# Patient Record
Sex: Male | Born: 1995 | Race: White | Hispanic: No | Marital: Single | Smoking: Never smoker
Health system: Southern US, Community
[De-identification: ages and names within clinical notes are randomized; demographics above are authoritative.]

---

## 2019-10-21 ENCOUNTER — Emergency Department: Payer: BC Managed Care – PPO

## 2019-10-21 DIAGNOSIS — R197 Diarrhea, unspecified: Secondary | ICD-10-CM | POA: Diagnosis not present

## 2019-10-21 DIAGNOSIS — Z20828 Contact with and (suspected) exposure to other viral communicable diseases: Secondary | ICD-10-CM | POA: Insufficient documentation

## 2019-10-21 DIAGNOSIS — R55 Syncope and collapse: Secondary | ICD-10-CM | POA: Diagnosis present

## 2019-10-21 DIAGNOSIS — R112 Nausea with vomiting, unspecified: Secondary | ICD-10-CM | POA: Diagnosis not present

## 2019-10-21 DIAGNOSIS — F1290 Cannabis use, unspecified, uncomplicated: Secondary | ICD-10-CM | POA: Insufficient documentation

## 2019-10-21 MED ORDER — SODIUM CHLORIDE 0.9 % IV BOLUS: 1000.0000 mL | Freq: Once | INTRAVENOUS | Status: DC

## 2019-10-21 NOTE — ED Provider Notes (Addendum)
-----------------------------------------   11:48 PM on 10/21/2019 -----------------------------------------  Discussed case with PA Roderic Palau.  In short, this is a young, otherwise healthy male who had a syncopal episode while watching a splint placement as a visitor in the emergency department.  He had been having several days of nausea, vomiting and diarrhea.  Denies this to me.  Lab work, UA, CT head pending. Patient sent to the lobby by charge nurse to await treatment room.   Paulette Blanch, MD 10/22/19 [id number]    Paulette Blanch, MD 10/22/19 0239   ----------------------------------------- 5:29 AM on 10/22/2019 -----------------------------------------  Patient was brought back to her treatment room.  His physical exam was within normal limits.  He is feeling significantly better with some IV fluids.  Updated patient on all test results.  ED ECG REPORT I, Shilynn Hoch J, the attending physician, personally viewed and interpreted this ECG.   Date: 10/22/2019  EKG Time: 0031  Rate: 91  Rhythm: normal EKG, normal sinus rhythm  Axis: Normal  Intervals:none  ST&T Change: Nonspecific   CT Head interpreted per Dr. Golden Circle: Normal head CT  CXR interpreted per Dr. Marguerita Merles: No acute cardiopulmonary abnormality.  Strict return precautions given. Patient verbalizes understanding and agrees with plan of care.    Paulette Blanch, MD 10/22/19 [phone]

## 2019-10-21 NOTE — ED Notes (Signed)
This pt was visiting girlfriend; this tech and [place], EDT in to put splint on pts girlfriend at the time. Pt started c/o dizziness and feeling like "passing out". [place] PA and [place] PA came into rm to assist. Pts CBG was checked and was at 102. Pt placed on bed and put in trendelenburg position.

## 2019-10-21 NOTE — ED Provider Notes (Signed)
[facility] Emergency Department Provider Note  ____________________________________________  Time seen: Approximately 11:37 PM  I have reviewed the triage vital signs and the nursing notes.   HISTORY  Chief Complaint No chief complaint on file.    HPI Eric Burton is a 23 y.o. male who presents the emergency department with his girlfriend.  Patient was a visitor with his girlfriend in the emergency department.  His girlfriend had been injured in a motor vehicle collision.  While visiting, patient began to feel faint and queasy, and had a syncopal episode.  Patient states that he had a history of syncope "a long time ago" but has had no recent syncopal episodes.  Patient states that he has had some GI upset over the last 3 to 4 days with some nausea, vomiting and diarrhea.  No fevers or chills.  No URI symptoms.  Patient states that he has not had much to eat or drink today.  Patient currently denies any headache, neck pain or stiffness, fevers or chills, chest pain, shortness of breath abdominal pain.  No nausea currently.  Patient was assessed by myself immediately during syncopal episode.  Initially patient was hard to arouse.  Patient slowly regained consciousness and awareness and was able to answer questions appropriately at this time.  Patient denies any chronic medical problems.  No chronic medications.         No past medical history on file.  There are no active problems to display for this patient.     Prior to Admission medications   Not on File    Allergies Patient has no allergy information on record.  No family history on file.  Social History Social History   Tobacco Use  . Smoking status: Not on file  Substance Use Topics  . Alcohol use: Not on file  . Drug use: Not on file     Review of Systems  Constitutional: No fever/chills Eyes: No visual changes. No discharge ENT: No upper respiratory complaints. Cardiovascular: no  chest pain. Respiratory: no cough. No SOB. Gastrointestinal: No abdominal pain.  3 to 4 days of nausea, vomiting, diarrhea..  No constipation. Genitourinary: Negative for dysuria. No hematuria Musculoskeletal: Negative for musculoskeletal pain. Skin: Negative for rash, abrasions, lacerations, ecchymosis. Neurological: Negative for headaches, focal weakness or numbness. 10-point ROS otherwise negative.  ____________________________________________   PHYSICAL EXAM:  VITAL SIGNS: ED Triage Vitals [10/21/19 2334]  Enc Vitals Group     BP (!) 126/52     Pulse Rate (!) 113     Resp 20     Temp 98.6 F (37 C)     Temp Source Oral     SpO2 97 %     Weight      Height      Head Circumference      Peak Flow      Pain Score      Pain Loc      Pain Edu?      Excl. in GC?      Constitutional: On initial assessment, patient was unconscious.  Patient regained consciousness and is alert and oriented at this time.. Well appearing and in no acute distress. Eyes: Conjunctivae are normal. PERRL. EOMI. Head: Atraumatic. ENT:      Ears:       Nose: No congestion/rhinnorhea.      Mouth/Throat: Mucous membranes are moist.  Neck: No stridor.  Neck is supple full range of motion Hematological/Lymphatic/Immunilogical: No cervical lymphadenopathy. Cardiovascular: Normal rate, regular  rhythm. Normal S1 and S2.  No murmurs, rubs, gallops.  No apical heave.  Good peripheral circulation. Respiratory: Normal respiratory effort without tachypnea or retractions. Lungs CTAB. Good air entry to the bases with no decreased or absent breath sounds. Gastrointestinal: Bowel sounds 4 quadrants. Soft and nontender to palpation. No guarding or rigidity. No palpable masses. No distention. No CVA tenderness. Musculoskeletal: Full range of motion to all extremities. No gross deformities appreciated. Neurologic:  Normal speech and language. No gross focal neurologic deficits are appreciated.  Skin:  Skin is warm,  dry and intact. No rash noted. Psychiatric: Mood and affect are normal. Speech and behavior are normal. Patient exhibits appropriate insight and judgement.   ____________________________________________   LABS (all labs ordered are listed, but only abnormal results are displayed)  Labs Reviewed  SARS CORONAVIRUS 2 (TAT 6-24 HRS)  CBC WITH DIFFERENTIAL/PLATELET  COMPREHENSIVE METABOLIC PANEL  URINALYSIS, ROUTINE W REFLEX MICROSCOPIC  URINE DRUG SCREEN, QUALITATIVE ([facility] ONLY)  LIPASE, BLOOD  TROPONIN I (HIGH SENSITIVITY)   ____________________________________________  EKG   ____________________________________________  RADIOLOGY   No results found.  ____________________________________________    PROCEDURES  Procedure(s) performed:    Procedures    Medications  sodium chloride 0.9 % bolus 1,000 mL (has no administration in time range)     ____________________________________________   INITIAL IMPRESSION / ASSESSMENT AND PLAN / ED COURSE  Pertinent labs & imaging results that were available during my care of the patient were reviewed by me and considered in my medical decision making (see chart for details).  Review of the [place] CSRS was performed in accordance of the [place] prior to dispensing any controlled drugs.          Patient presented to the emergency department visit another individual who is currently receiving care.  Patient had a syncopal episode in the room.  Patient is currently alert and oriented.  At this time, orders have been placed for evaluation.  This section emergency department is closing and the patient will be transferred to another section to await completion of work-up.  Patient care will be transferred to attending provider, Dr. Beather Arbour at this time.  Dr. Beather Arbour is made aware of the patient's presentation, physical exam and orders at this point.  Final diagnosis and disposition will be provided by Dr. Beather Arbour.     This chart was dictated  using voice recognition software/Dragon. Despite best efforts to proofread, errors can occur which can change the meaning. Any change was purely unintentional.    Darletta Moll, PA-C 10/21/19 2350    Paulette Blanch, MD 10/23/19 [phone]

## 2019-10-22 ENCOUNTER — Encounter: Payer: Self-pay | Admitting: *Deleted

## 2019-10-22 ENCOUNTER — Other Ambulatory Visit: Payer: Self-pay

## 2019-10-22 ENCOUNTER — Emergency Department
Admission: EM | Admit: 2019-10-22 | Discharge: 2019-10-22 | Disposition: A | Discharge status: Home / Self Care | Payer: BC Managed Care – PPO | Attending: Emergency Medicine | Admitting: Emergency Medicine

## 2019-10-22 DIAGNOSIS — R55 Syncope and collapse: Secondary | ICD-10-CM

## 2019-10-22 DIAGNOSIS — F1290 Cannabis use, unspecified, uncomplicated: Secondary | ICD-10-CM

## 2019-10-22 LAB — COMPREHENSIVE METABOLIC PANEL
ALT: 72 U/L — ABNORMAL HIGH (ref 0–44)
AST: 44 U/L — ABNORMAL HIGH (ref 15–41)
Albumin: 4.4 g/dL (ref 3.5–5.0)
Alkaline Phosphatase: 53 U/L (ref 38–126)
Anion gap: 14 (ref 5–15)
BUN: 16 mg/dL (ref 6–20)
CO2: 23 mmol/L (ref 22–32)
Calcium: 9.3 mg/dL (ref 8.9–10.3)
Chloride: 102 mmol/L (ref 98–111)
Creatinine, Ser: 0.81 mg/dL (ref 0.61–1.24)
GFR calc Af Amer: >60 mL/min (ref >60)
GFR calc non Af Amer: >60 mL/min (ref >60)
Glucose, Bld: 133 mg/dL — ABNORMAL HIGH (ref 70–99)
Potassium: 3.9 mmol/L (ref 3.5–5.1)
Sodium: 139 mmol/L (ref 135–145)
Total Bilirubin: 1.2 mg/dL (ref 0.3–1.2)
Total Protein: 7.7 g/dL (ref 6.5–8.1)

## 2019-10-22 LAB — CBC WITH DIFFERENTIAL/PLATELET
Abs Immature Granulocytes: 0.05 10*3/uL (ref 0.00–0.07)
Basophils Absolute: 0.1 10*3/uL (ref 0.0–0.1)
Basophils Relative: 1 %
Eosinophils Absolute: 0.1 10*3/uL (ref 0.0–0.5)
Eosinophils Relative: 1 %
HCT: 45.7 % (ref 39.0–52.0)
Hemoglobin: 15 g/dL (ref 13.0–17.0)
Immature Granulocytes: 1 %
Lymphocytes Relative: 30 %
Lymphs Abs: 3.2 10*3/uL (ref 0.7–4.0)
MCH: 27.6 pg (ref 26.0–34.0)
MCHC: 32.8 g/dL (ref 30.0–36.0)
MCV: 84 fL (ref 80.0–100.0)
Monocytes Absolute: 0.7 10*3/uL (ref 0.1–1.0)
Monocytes Relative: 6 %
Neutro Abs: 6.7 10*3/uL (ref 1.7–7.7)
Neutrophils Relative %: 61 %
Platelets: 257 10*3/uL (ref 150–400)
RBC: 5.44 MIL/uL (ref 4.22–5.81)
RDW: 12.9 % (ref 11.5–15.5)
WBC: 10.7 10*3/uL — ABNORMAL HIGH (ref 4.0–10.5)
nRBC: 0 % (ref 0.0–0.2)

## 2019-10-22 LAB — URINALYSIS, ROUTINE W REFLEX MICROSCOPIC
Bacteria, UA: NONE SEEN
Bilirubin Urine: NEGATIVE
Glucose, UA: NEGATIVE mg/dL
Hgb urine dipstick: NEGATIVE
Ketones, ur: NEGATIVE mg/dL
Leukocytes,Ua: NEGATIVE
Nitrite: NEGATIVE
Protein, ur: 100 mg/dL — AB
Specific Gravity, Urine: 1.025 (ref 1.005–1.030)
Squamous Epithelial / HPF: NONE SEEN (ref 0–5)
pH: 6 (ref 5.0–8.0)

## 2019-10-22 LAB — URINE DRUG SCREEN, QUALITATIVE (ARMC ONLY)
Amphetamines, Ur Screen: NOT DETECTED
Barbiturates, Ur Screen: NOT DETECTED
Benzodiazepine, Ur Scrn: NOT DETECTED
Cannabinoid 50 Ng, Ur ~~LOC~~: POSITIVE — AB
Cocaine Metabolite,Ur ~~LOC~~: NOT DETECTED
MDMA (Ecstasy)Ur Screen: NOT DETECTED
Methadone Scn, Ur: NOT DETECTED
Opiate, Ur Screen: NOT DETECTED
Phencyclidine (PCP) Ur S: NOT DETECTED
Tricyclic, Ur Screen: NOT DETECTED

## 2019-10-22 LAB — GLUCOSE, CAPILLARY: Glucose-Capillary: 102 mg/dL — ABNORMAL HIGH (ref 70–99)

## 2019-10-22 LAB — LIPASE, BLOOD: Lipase: 35 U/L (ref 11–51)

## 2019-10-22 LAB — SARS CORONAVIRUS 2 (TAT 6-24 HRS): SARS Coronavirus 2: NEGATIVE

## 2019-10-22 LAB — TROPONIN I (HIGH SENSITIVITY)
Troponin I (High Sensitivity): 3 ng/L (ref <18)
Troponin I (High Sensitivity): <2 ng/L (ref <18)

## 2019-10-22 MED ORDER — SODIUM CHLORIDE 0.9 % IV BOLUS
1000.0000 mL | Freq: Once | INTRAVENOUS | Status: AC
  Administered 2019-10-22: 1000 mL via INTRAVENOUS

## 2019-10-22 MED ORDER — ONDANSETRON 4 MG PO TBDP: 4.0000 mg | ORAL_TABLET | Freq: Once | ORAL | Status: DC

## 2019-10-22 NOTE — Discharge Instructions (Signed)
Drink plenty of fluids daily.  Return to the ER for recurrent or worsening symptoms, persistent vomiting, difficulty breathing or other concerns. °

## 2019-10-22 NOTE — ED Notes (Signed)
Brought pt back to draw repeat troponin, during blood draw, pt reports he felt like he was going to pass out. Pt became diaphoretic and pale, pressure 77/42, laid pt down in chair, reports he is feeling better, BP 116/52

## 2019-10-22 NOTE — ED Notes (Signed)
Pt resting on stretcher with visitor at the bedside. Pt to complete fluid bolus prior to discharge. Iv fluids infusing without difficulty.

## 2020-04-08 ENCOUNTER — Encounter: Payer: Self-pay | Admitting: Emergency Medicine

## 2020-04-08 ENCOUNTER — Emergency Department
Admission: EM | Admit: 2020-04-08 | Discharge: 2020-04-08 | Disposition: A | Discharge status: Home / Self Care | Attending: Emergency Medicine | Admitting: Emergency Medicine

## 2020-04-08 ENCOUNTER — Emergency Department

## 2020-04-08 ENCOUNTER — Other Ambulatory Visit: Payer: Self-pay

## 2020-04-08 DIAGNOSIS — Y929 Unspecified place or not applicable: Secondary | ICD-10-CM | POA: Diagnosis not present

## 2020-04-08 DIAGNOSIS — S61221A Laceration with foreign body of left index finger without damage to nail, initial encounter: Secondary | ICD-10-CM | POA: Insufficient documentation

## 2020-04-08 DIAGNOSIS — Y990 Civilian activity done for income or pay: Secondary | ICD-10-CM | POA: Insufficient documentation

## 2020-04-08 DIAGNOSIS — S6992XA Unspecified injury of left wrist, hand and finger(s), initial encounter: Secondary | ICD-10-CM | POA: Diagnosis present

## 2020-04-08 DIAGNOSIS — Y939 Activity, unspecified: Secondary | ICD-10-CM | POA: Diagnosis not present

## 2020-04-08 DIAGNOSIS — W269XXA Contact with unspecified sharp object(s), initial encounter: Secondary | ICD-10-CM | POA: Diagnosis not present

## 2020-04-08 DIAGNOSIS — Z23 Encounter for immunization: Secondary | ICD-10-CM | POA: Diagnosis not present

## 2020-04-08 MED ORDER — CEPHALEXIN 500 MG PO CAPS: 500.0000 mg | ORAL_CAPSULE | Freq: Three times a day (TID) | ORAL | 0 refills | Status: AC

## 2020-04-08 MED ORDER — TETANUS-DIPHTH-ACELL PERTUSSIS 5-2.5-18.5 LF-MCG/0.5 IM SUSP
0.5000 mL | Freq: Once | INTRAMUSCULAR | Status: AC
  Administered 2020-04-08: 0.5 mL via INTRAMUSCULAR
  Filled 2020-04-08: qty 0.5

## 2020-04-08 MED ORDER — LIDOCAINE HCL (PF) 1 % IJ SOLN
5.0000 mL | Freq: Once | INTRAMUSCULAR | Status: AC
  Administered 2020-04-08: 5 mL via INTRADERMAL
  Filled 2020-04-08: qty 5

## 2020-04-08 MED ORDER — CEPHALEXIN 500 MG PO CAPS
500.0000 mg | ORAL_CAPSULE | Freq: Once | ORAL | Status: AC
  Administered 2020-04-08: 500 mg via ORAL
  Filled 2020-04-08: qty 1

## 2020-04-08 MED ORDER — HYDROCODONE-ACETAMINOPHEN 5-325 MG PO TABS: 1.0000 | ORAL_TABLET | Freq: Four times a day (QID) | ORAL | 0 refills | Status: AC | PRN

## 2020-04-08 NOTE — Discharge Instructions (Addendum)
Please begin antibiotics in the morning. Change dressing tomorrow. Please return to the ER in 2 days for wound recheck. Please call ortho on Monday for follow up appointment next week.

## 2020-04-08 NOTE — ED Notes (Signed)
Patient is soaking left 2nd finger and tolerating it well.

## 2020-04-08 NOTE — ED Triage Notes (Signed)
Pt reprot was at work and working with a grinding machine and hurt left index finger, pt reports went to urgent care and was sent to ER to further evaluate it. Pt is calm, no distress noted, no bleeding at present.

## 2020-04-08 NOTE — ED Provider Notes (Signed)
[facility] Emergency Department Provider Note  ____________________________________________  Time seen: Approximately 9:51 PM  I have reviewed the triage vital signs and the nursing notes.   HISTORY  Chief Complaint Finger Injury    HPI Eric Burton is a 24 y.o. male that presents to the  emergency department for evaluation of left finger laceration.  Patient cut his finger on a grinder at work.  He was referred to the emergency department from [facility].  He is able to bend his finger but it is painful.  He is unsure of last tetanus.  History reviewed. No pertinent past medical history.  There are no problems to display for this patient.   History reviewed. No pertinent surgical history.  Prior to Admission medications   Not on File    Allergies Patient has no known allergies.  No family history on file.  Social History Social History   Tobacco Use  . Smoking status: Never Smoker  Substance Use Topics  . Alcohol use: Not Currently  . Drug use: Not Currently     Review of Systems  Gastrointestinal: No nausea, no vomiting.  Musculoskeletal: Positive for finger pain. Skin: Negative for rash, ecchymosis. Positive for laceration. Neurological: Negative for numbness or tingling   ____________________________________________   PHYSICAL EXAM:  VITAL SIGNS: ED Triage Vitals [04/08/20 2108]  Enc Vitals Group     BP 132/75     Pulse Rate 65     Resp 20     Temp 97.8 F (36.6 C)     Temp Source Oral     SpO2 99 %     Weight 275 lb (124.7 kg)     Height 6' (1.829 m)     Head Circumference      Peak Flow      Pain Score 3     Pain Loc      Pain Edu?      Excl. in GC?      Constitutional: Alert and oriented. Well appearing and in no acute distress. Eyes: Conjunctivae are normal. PERRL. EOMI. Head: Atraumatic. ENT:      Ears:      Nose: No congestion/rhinnorhea.      Mouth/Throat: Mucous membranes are moist.  Neck: No  stridor. Cardiovascular: Normal rate, regular rhythm.  Good peripheral circulation. Respiratory: Normal respiratory effort without tachypnea or retractions. Lungs CTAB. Good air entry to the bases with no decreased or absent breath sounds. Musculoskeletal: Full range of motion to all extremities. No gross deformities appreciated.  Able to flex and extend finger. Neurologic:  Normal speech and language. No gross focal neurologic deficits are appreciated.  Skin:  Skin is warm, dry.  2 cm dirty  laceration to dorsal left index finger over PIP joint. Psychiatric: Mood and affect are normal. Speech and behavior are normal. Patient exhibits appropriate insight and judgement.   ____________________________________________   LABS (all labs ordered are listed, but only abnormal results are displayed)  Labs Reviewed - No data to display ____________________________________________  EKG   ____________________________________________  RADIOLOGY Lexine Baton, personally viewed and evaluated these images (plain radiographs) as part of my medical decision making, as well as reviewing the written report by the radiologist.  DG Finger Index Left  Result Date: 04/08/2020 CLINICAL DATA:  Second digit laceration while using grinding machine, initial encounter EXAM: LEFT INDEX FINGER 2+V COMPARISON:  None. FINDINGS: Soft tissue swelling is noted consistent with the given clinical history. No acute bony abnormality is seen. No  radiopaque foreign body is noted. IMPRESSION: Soft tissue swelling without acute bony abnormality. Electronically Signed   By: Inez Catalina M.D.   On: 04/08/2020 21:28    ____________________________________________    PROCEDURES  Procedure(s) performed:    Procedures  LACERATION REPAIR Performed by: Laban Emperor  Consent: Verbal consent obtained.  Consent given by: patient  Prepped and Draped in normal sterile fashion  Wound explored: No foreign bodies    Laceration Location: finger  Laceration Length: 1 cm  Anesthesia: None  Local anesthetic: lidocaine 1% without epinephrine  Anesthetic total: 4 ml  Irrigation method: syringe  Amount of cleaning: 543ml normal saline  Skin closure: Steri strips  Patient tolerance: Patient tolerated the procedure well with no immediate complications.  Medications  lidocaine (PF) (XYLOCAINE) 1 % injection 5 mL (has no administration in time range)  Tdap (BOOSTRIX) injection 0.5 mL (has no administration in time range)  cephALEXin (KEFLEX) capsule 500 mg (has no administration in time range)     ____________________________________________   INITIAL IMPRESSION / ASSESSMENT AND PLAN / ED COURSE  Pertinent labs & imaging results that were available during my care of the patient were reviewed by me and considered in my medical decision making (see chart for details).  Review of the [place] CSRS was performed in accordance of the [place] prior to dispensing any controlled drugs.   Patient presented to the emergency department for evaluation of finger injury.  Vital signs and exam are reassuring.  X-ray negative for acute bony abnormalities.  Patient has a dirty laceration to the dorsal left index finger.  Patient cut his finger on a grinder and has blackened skin with metal shavings and debris inside laceration.  Laceration was thoroughly cleaned by myself and [facility].  I attempted to remove as much shavings and debris as possible.  Case was discussed with Dr. Darl Householder, who is in agreement that laceration would best be repaired with Steri-Strips and would have a higher risk of infection with sutures.  Patient will have close follow-up.  Laceration was repaired with Steri-Strips.  Splint was placed.  Patient will be discharged home with prescriptions for Keflex and a short course of Vicodin. Patient is to follow up with emergency department and orthopedics.  As directed. Patient is given ED precautions to return to  the ED for any worsening or new symptoms.  Eric Burton was evaluated in Emergency Department on 04/08/2020 for the symptoms described in the history of present illness. He was evaluated in the context of the global COVID-19 pandemic, which necessitated consideration that the patient might be at risk for infection with the SARS-CoV-2 virus that causes COVID-19. Institutional protocols and algorithms that pertain to the evaluation of patients at risk for COVID-19 are in a state of rapid change based on information released by regulatory bodies including the CDC and federal and state organizations. These policies and algorithms were followed during the patient's care in the ED   ____________________________________________  FINAL CLINICAL IMPRESSION(S) / ED DIAGNOSES  Final diagnoses:  None      NEW MEDICATIONS STARTED DURING THIS VISIT:  ED Discharge Orders    None          This chart was dictated using voice recognition software/Dragon. Despite best efforts to proofread, errors can occur which can change the meaning. Any change was purely unintentional.    Laban Emperor, PA-C 04/11/20 1810    Drenda Freeze, MD 04/12/20 [phone]

## 2020-11-29 ENCOUNTER — Other Ambulatory Visit: Payer: Self-pay | Admitting: Physician Assistant

## 2020-11-29 DIAGNOSIS — R7989 Other specified abnormal findings of blood chemistry: Secondary | ICD-10-CM

## 2020-12-21 ENCOUNTER — Ambulatory Visit
Admission: RE | Admit: 2020-12-21 | Discharge: 2020-12-21 | Disposition: A | Discharge status: Home / Self Care | Payer: BC Managed Care – PPO | Source: Ambulatory Visit | Attending: Physician Assistant | Admitting: Physician Assistant

## 2020-12-21 DIAGNOSIS — R7989 Other specified abnormal findings of blood chemistry: Secondary | ICD-10-CM

## 2022-04-22 IMAGING — US US ABDOMEN COMPLETE
1 series · 14 of 25 positions shown · non-contrast
Comparison: None.

CLINICAL DATA: Elevated LFTs

EXAM:
ABDOMEN ULTRASOUND COMPLETE

[Series 1: us abdomen complete · 0.34mm/px · 14 of 90 slices shown]
[im 1/90]
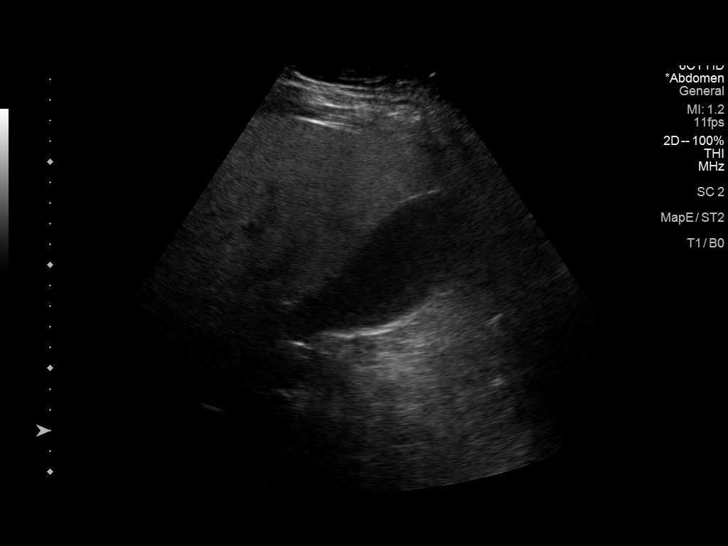
[im 8/90]
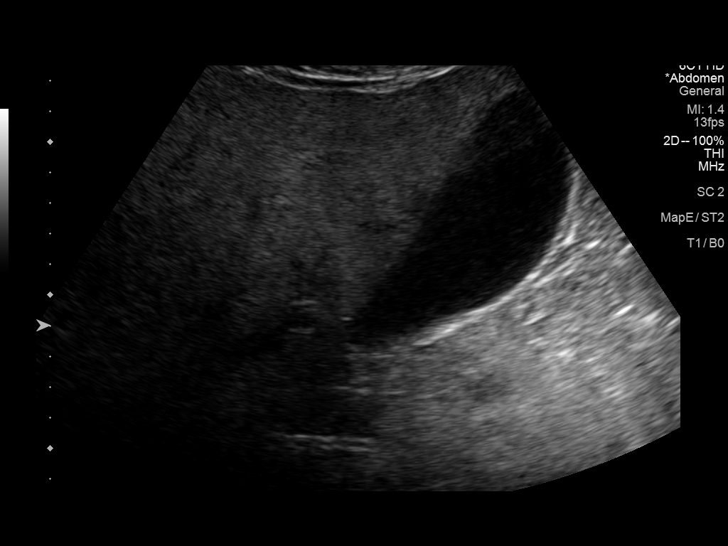
[im 15/90]
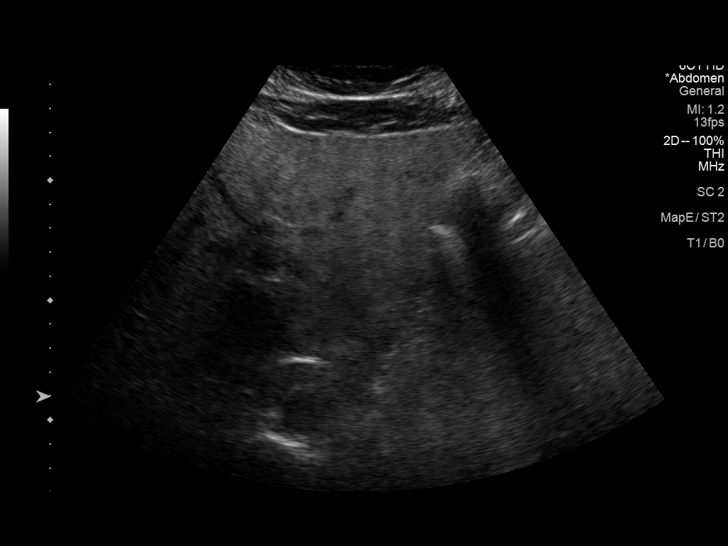
[im 23/90]
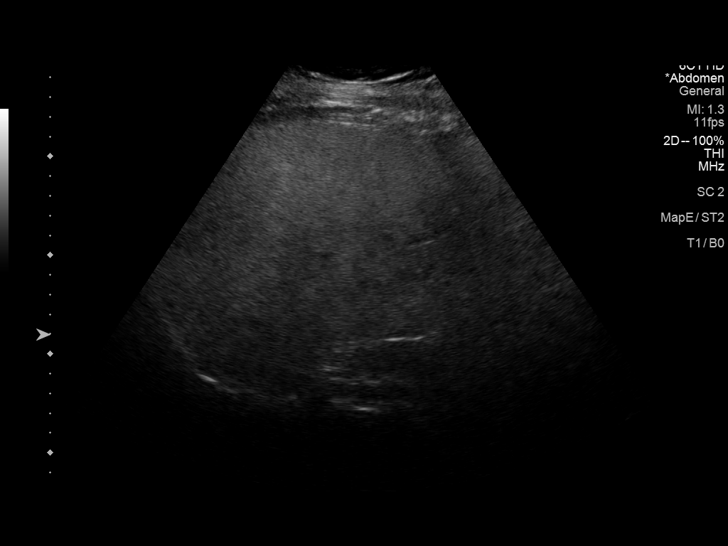
[im 30/90]
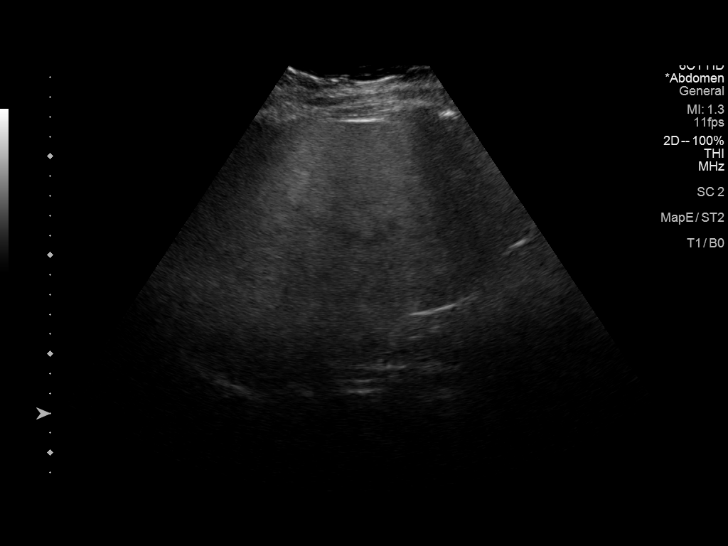
[im 34/90]
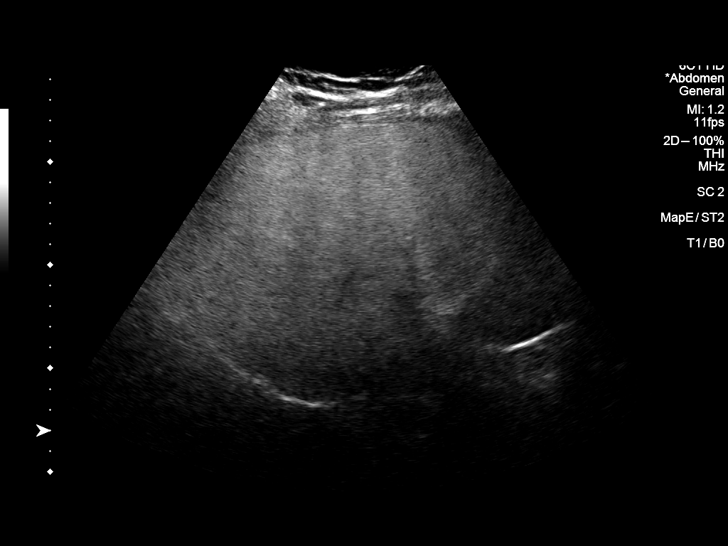
[im 41/90]
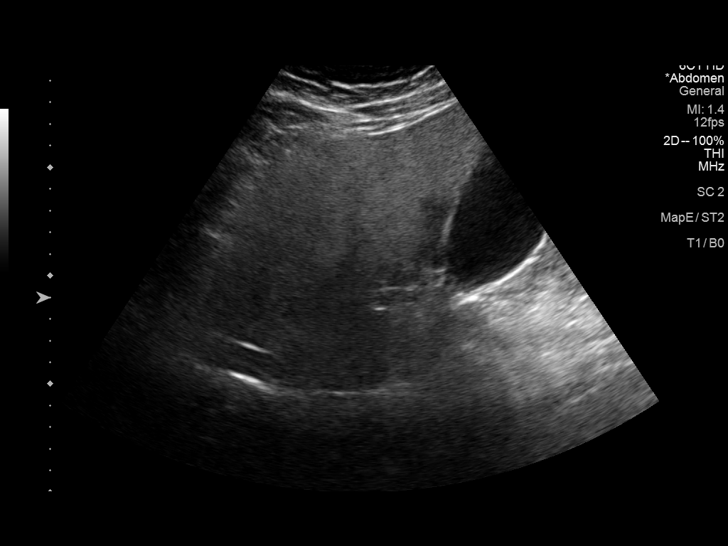
[im 49/90]
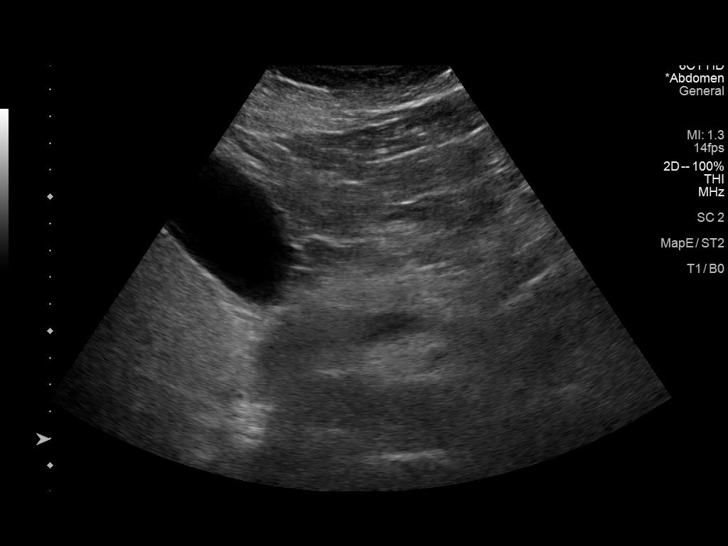
[im 56/90]
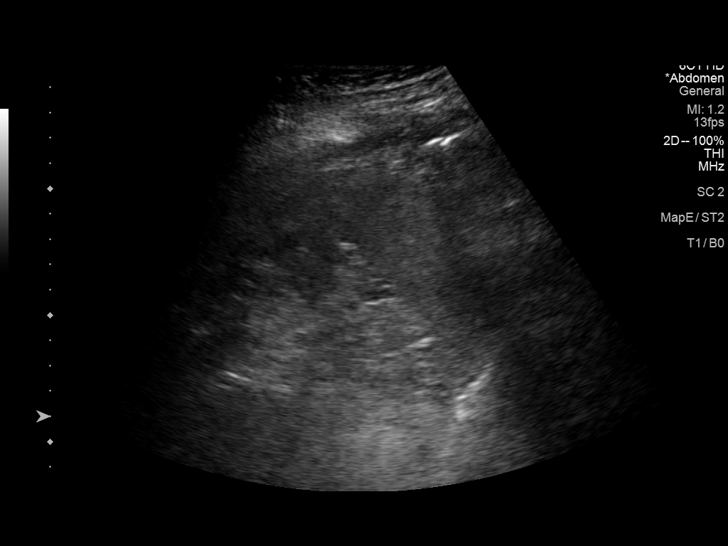
[im 60/90]
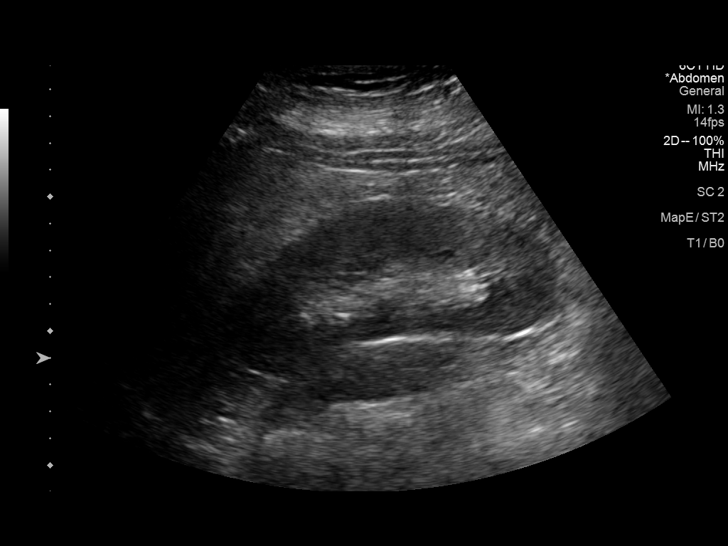
[im 67/90]
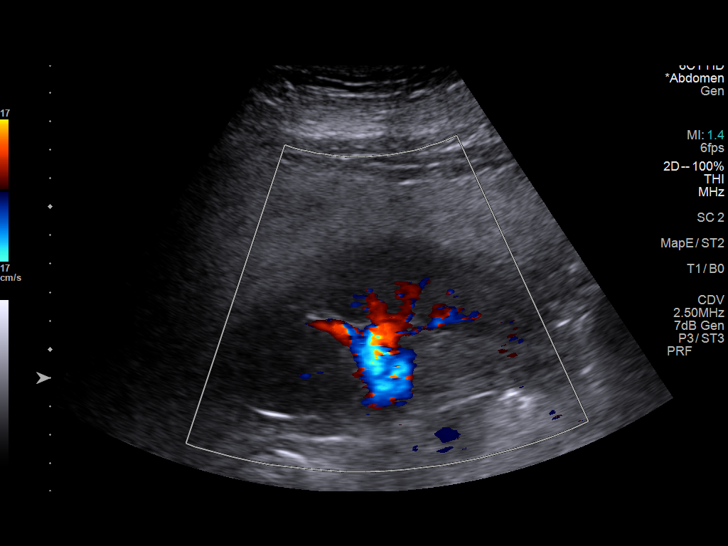
[im 75/90]
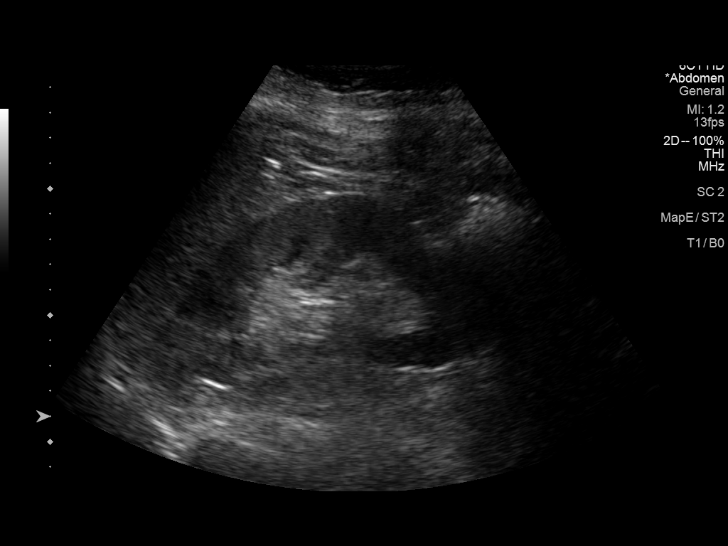
[im 82/90]
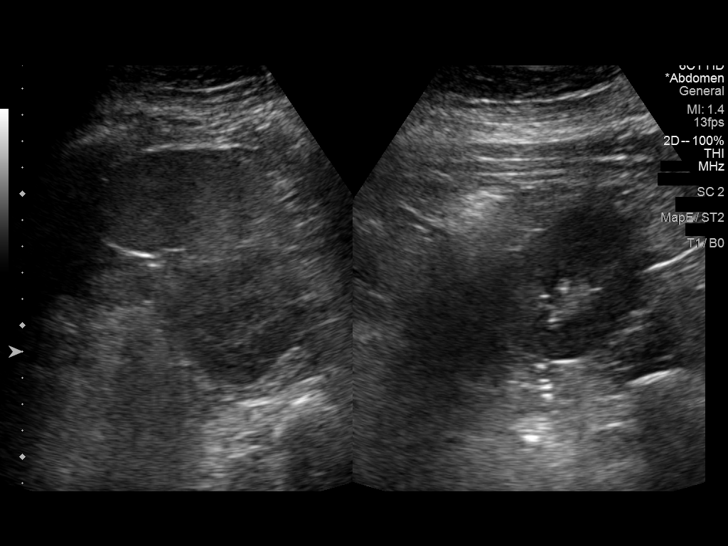
[im 90/90]
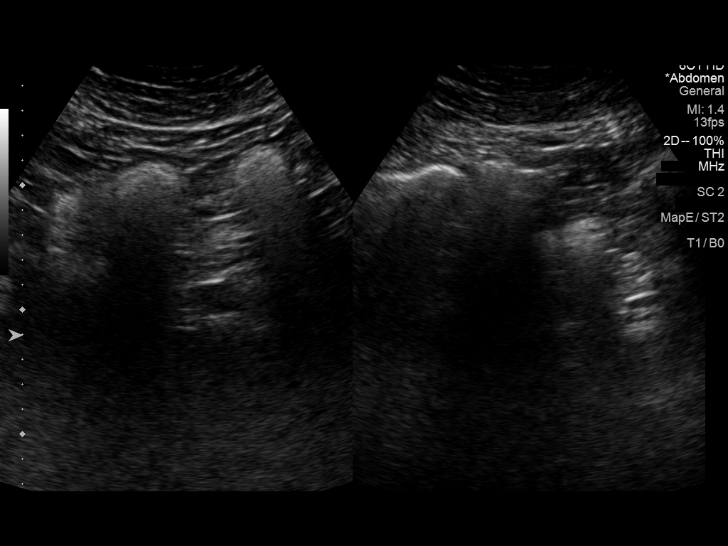

[14 of 25 positions shown; findings below may reference images not displayed]

FINDINGS: Gallbladder: No gallstones or wall thickening visualized. No
sonographic Murphy sign noted by sonographer.

Common bile duct: Diameter: 4.6 mm

Liver: No focal lesion identified. Increased parenchymal
echogenicity with focal fatty sparing along the gallbladder fossa.
Portal vein is patent on color Doppler imaging with normal direction
of blood flow towards the liver.

IVC: Overlying bowel gas limits evaluation.

Pancreas: Visualized portion unremarkable.

Spleen: Size and appearance within normal limits.

Right Kidney: Length: 12.7 cm. Echogenicity within normal limits. No
mass or hydronephrosis visualized.

Left Kidney: Length: 12.8 cm. Echogenicity within normal limits. No
mass or hydronephrosis visualized.

Abdominal aorta: No aneurysm visualized.

Other findings: None.
IMPRESSION: Hepatic steatosis.

Otherwise unremarkable abdominal ultrasound.
